# Patient Record
Sex: Male | Born: 1960 | Race: White | Hispanic: No | Marital: Married | Smoking: Former smoker
Health system: Southern US, Community
[De-identification: ages and names within clinical notes are randomized; demographics above are authoritative.]

## PROBLEM LIST (undated history)

## (undated) DIAGNOSIS — Z87442 Personal history of urinary calculi: Secondary | ICD-10-CM

## (undated) DIAGNOSIS — M1990 Unspecified osteoarthritis, unspecified site: Secondary | ICD-10-CM

## (undated) HISTORY — PX: NO PAST SURGERIES: SHX2092

---

## 2012-06-02 ENCOUNTER — Ambulatory Visit: Payer: Self-pay | Admitting: Internal Medicine

## 2012-09-02 ENCOUNTER — Inpatient Hospital Stay: Payer: Self-pay | Admitting: Urology

## 2012-09-02 ENCOUNTER — Emergency Department: Payer: Self-pay | Admitting: Emergency Medicine

## 2012-09-02 LAB — URINALYSIS, COMPLETE
Bacteria: NONE SEEN
Glucose,UR: NEGATIVE mg/dL (ref 0–75)
Ketone: NEGATIVE
Leukocyte Esterase: NEGATIVE
Ph: 5 (ref 4.5–8.0)
Specific Gravity: 1.014 (ref 1.003–1.030)
WBC UR: 1 /HPF (ref 0–5)

## 2012-09-02 LAB — BASIC METABOLIC PANEL
Anion Gap: 9 (ref 7–16)
BUN: 14 mg/dL (ref 7–18)
Calcium, Total: 8.2 mg/dL — ABNORMAL LOW (ref 8.5–10.1)
Chloride: 103 mmol/L (ref 98–107)
Creatinine: 0.83 mg/dL (ref 0.60–1.30)
EGFR (African American): >60
EGFR (Non-African Amer.): >60
Glucose: 107 mg/dL — ABNORMAL HIGH (ref 65–99)
Sodium: 135 mmol/L — ABNORMAL LOW (ref 136–145)

## 2012-09-02 LAB — CBC
HCT: 40.6 % (ref 40.0–52.0)
MCH: 32.5 pg (ref 26.0–34.0)
Platelet: 230 10*3/uL (ref 150–440)
RBC: 4.28 10*6/uL — ABNORMAL LOW (ref 4.40–5.90)
RDW: 13.4 % (ref 11.5–14.5)
WBC: 7.4 10*3/uL (ref 3.8–10.6)

## 2012-09-06 ENCOUNTER — Ambulatory Visit: Payer: Self-pay | Admitting: Urology

## 2012-09-07 ENCOUNTER — Emergency Department: Payer: Self-pay | Admitting: Emergency Medicine

## 2012-09-07 LAB — CBC
HGB: 14.9 g/dL (ref 13.0–18.0)
MCH: 32.6 pg (ref 26.0–34.0)
MCV: 95 fL (ref 80–100)
RBC: 4.57 10*6/uL (ref 4.40–5.90)
RDW: 13.4 % (ref 11.5–14.5)

## 2012-09-07 LAB — URINALYSIS, COMPLETE
Bilirubin,UR: NEGATIVE
Ketone: NEGATIVE
Leukocyte Esterase: NEGATIVE
Ph: 5 (ref 4.5–8.0)
RBC,UR: 45 /HPF (ref 0–5)
Specific Gravity: 1.026 (ref 1.003–1.030)
Squamous Epithelial: <1
WBC UR: 4 /HPF (ref 0–5)

## 2012-09-08 LAB — BASIC METABOLIC PANEL
Anion Gap: 7 (ref 7–16)
Calcium, Total: 9.3 mg/dL (ref 8.5–10.1)
Co2: 24 mmol/L (ref 21–32)
EGFR (Non-African Amer.): 44 — ABNORMAL LOW
Glucose: 110 mg/dL — ABNORMAL HIGH (ref 65–99)
Osmolality: 283 (ref 275–301)
Potassium: 4.5 mmol/L (ref 3.5–5.1)

## 2013-07-18 ENCOUNTER — Other Ambulatory Visit: Payer: Self-pay | Admitting: Rheumatology

## 2013-07-18 LAB — SYNOVIAL CELL COUNT + DIFF, W/ CRYSTALS
Basophil: 0 %
Crystals, Joint Fluid: NONE SEEN
EOS PCT: 0 %
Lymphocytes: 81 %
Neutrophils: 9 %
Nucleated Cell Count: 27 /mm3
Other Cells BF: 0 %
Other Mononuclear Cells: 11 %

## 2014-07-14 NOTE — Op Note (Signed)
PATIENT NAME:  Dwayne Salazar, Dwayne Salazar MR#:  [id number] DATE OF BIRTH:  1960-08-09  DATE OF PROCEDURE:  09/03/2012  PREOPERATIVE DIAGNOSES: 1.  Right proximal ureteral calculus.  2.  Right renal colic secondary to above.  POSTOPERATIVE DIAGNOSES: 1.  Right proximal ureteral calculus.  2.  Right renal colic secondary to above.   PROCEDURES: 1.  Right ureteroscopy with holmium laser lithotripsy/stone extraction.  2.  Placement right ureteral stent.   SURGEON:  Irineo AxonScott Kamarii Carton, M.D.   ASSISTANT:  None.   ANESTHESIA:  General.   INDICATION:  A 54 year old male admitted June 12 with refractory right renal colic secondary to a 7 mm right proximal ureteral stone.  After discussing treatment options, he has elected to proceed with ureteroscopy.   DESCRIPTION OF PROCEDURE:  He was taken to the cystoscopy suite and placed in supine position.  A general anesthetic was administered via an LMA.  He was then placed in the low lithotomy position and his external genitalia were prepped and draped in the usual sterile fashion.  Timeout was performed per protocol, with all in agreement.  A 22 French Olympus cystoscope was lubricated and passed under direct vision.  The urethra was normal in caliber without stricture.  Prostate was remarkable for moderate lateral lobe enlargement and moderate elevation of the bladder neck.  The bladder mucosa was closely inspected and panendoscopy shows no erythema, solid or papillary lesions.  The left ureteral orifice was normal-appearing with clear efflux.  No efflux was seen from the right ureteral orifice although it was normal in appearance.  A 0.035 Glidewire was placed through the cystoscope and into the right ureteral orifice.  Wire was easily passed up into the right renal pelvis under fluoroscopic guidance.  The cystoscope was removed and a 6 French semirigid ureteroscope was lubricated and passed under direct vision.  The right ureteral orifice was engaged without dilation.  The  scope was advanced proximally and the stone was identified in the lower part of the right proximal ureter.  A 365 micron holmium laser fiber was placed through the ureteroscope.  The stone was fragmented at a power of 5 watts.  Fragments were removed with an Escape basket and dropped into the bladder.  The last large fragment was grasped and would not advance beyond the distal ureter.  A 100 micron holmium laser fiber was placed through the ureteroscope.  The stone was fragmented in a basket and removed.  Basket was removed intact.  The ureteroscope was advanced up to the UPJ and no fragments were identified other than fine powdery residual ones.  The fragments dropped in the bladder were irrigated with re-passage of the cystoscope.  A 6 French/26 cm Microvasive Contour ureteral stent was placed over the guidewire.  Prior to ureteroscope removal and stent placement retrograde pyelogram was performed which showed no extravasation.  The stent showed a good curl in the renal pelvis on fluoroscopy and the distal end was well-positioned in the bladder.  The stent was left attached to a pull string and will be removed at a later date.  He was taken to the PACU in stable condition.  There were no complications.  EBL was minimal.  A B and O suppository was placed per rectum postop.     ____________________________ Verna CzechScott C. Lonna CobbStoioff, MD scs:ea D: 09/03/2012 19:21:28 ET T: 09/04/2012 06:12:30 ET JOB#: [id number]  cc: Lorin PicketScott C. Lonna CobbStoioff, MD, <Dictator> Riki AltesSCOTT C Levie Wages MD ELECTRONICALLY SIGNED 09/15/2012 13:18

## 2014-07-14 NOTE — H&P (Signed)
PATIENT NAME:  Dwayne Salazar, Dwayne Salazar MR#:  [id number] DATE OF BIRTH:  1960-07-01  DATE OF ADMISSION:  09/02/2012  ADMISSION DIAGNOSES:  1.  Right proximal ureteral calculus.  2.  Right renal colic.   HISTORY OF PRESENT ILLNESS: A 54 year old male, who late on the evening of 6/11 had acute onset of right flank pain radiating to the right lower quadrant. This pain was associated with nausea. He denied fever, chills, frequency, urgency or dysuria. He has noted intermittent hematuria. Pain was rated as severe and intermittently sharp and stabbing. There were no identifiable precipitating, aggravating or alleviating factors. A stone protocol CT was performed, which was remarkable for a 7 mm right proximal ureteral calculus with moderate hydronephrosis and hydroureter. He received parenteral analgesics and was discharged on oral analgesics; however, continued to have intermittent pain, which was not controlled with his pain medication. He returned to the Emergency Department the afternoon of 06/12 complaining of persistent pain. His pain was not able to be adequately controlled for outpatient management and admission was requested. There is no previous history of stone disease or urologic problems.   PAST MEDICAL HISTORY: He is healthy without chronic medical illness.   PAST SURGICAL HISTORY: None.   MEDICATIONS ON ADMISSION: Zofran ODT 4 mg t.i.d. p.r.n., Percocet 5/325, 1 p.o. q. 6 hours p.r.n. pain and tamsulosin 0.4 mg daily.   ALLERGIES: NKDA.   SOCIAL HISTORY: No tobacco. Occasional alcohol use.   REVIEW OF SYSTEMS: GENERAL: Denies fever or chills. HEENT: No headache or vision change. PULMONARY: Denies cough or shortness of breath. CARDIOVASCULAR: Denies chest pain or palpitations. GASTROINTESTINAL: Positive nausea  and vomiting. GENITOURINARY: As per the HPI. ENDOCRINE: No excessive thirst. NEUROLOGIC: Denies history of CVA or seizure. DERMATOLOGIC: Negative rash. MUSCULOSKELETAL: Negative bone or joint  pain. HEMATOLOGY: No history of bleeding or clotting disorders.   PHYSICAL EXAMINATION:  VITAL SIGNS: Temperature 98.8, pulse 66, blood pressure 130/72. GENERAL: The patient is alert, cooperative in no acute distress.  HEENT: Oral cavity is clear.  NECK: Supple without adenopathy.  LUNGS: Clear to auscultation.  CARDIOVASCULAR: Regular rate and rhythm without murmur.  ABDOMEN: Soft, nontender without masses.  BACK: No CVA tenderness.  NEUROLOGIC: No focal deficits.   DATA: Stone protocol CT was reviewed and the 7 mm right proximal stone is easily visualized. There is moderate hydronephrosis and hydroureter. Creatinine 0.83. Urinalysis 60 RBCs and 1 WBC.   IMPRESSION: Right proximal ureteral calculus/right renal colic.   PLAN: The patient is admitted for parenteral analgesia. Based on stone size and location, it is unlikely it will pass spontaneously and management options will be discussed.    ____________________________ Verna CzechScott C. Lonna CobbStoioff, MD scs:aw D: 09/03/2012 08:02:10 ET T: 09/03/2012 08:10:11 ET JOB#: [id number]  cc: Dwayne PicketScott C. Lonna CobbStoioff, MD, <Dictator> Dwayne AltesSCOTT C STOIOFF MD ELECTRONICALLY SIGNED 09/15/2012 13:15

## 2016-08-04 ENCOUNTER — Other Ambulatory Visit: Payer: Self-pay | Admitting: Internal Medicine

## 2016-08-04 DIAGNOSIS — R413 Other amnesia: Secondary | ICD-10-CM

## 2018-08-19 ENCOUNTER — Other Ambulatory Visit: Payer: Self-pay | Admitting: General Surgery

## 2018-08-19 DIAGNOSIS — R1031 Right lower quadrant pain: Secondary | ICD-10-CM

## 2018-08-19 DIAGNOSIS — R1032 Left lower quadrant pain: Secondary | ICD-10-CM

## 2018-08-25 ENCOUNTER — Ambulatory Visit: Payer: BLUE CROSS/BLUE SHIELD

## 2018-09-03 ENCOUNTER — Ambulatory Visit
Admission: RE | Admit: 2018-09-03 | Discharge: 2018-09-03 | Disposition: A | Discharge status: Home / Self Care | Payer: BLUE CROSS/BLUE SHIELD | Source: Ambulatory Visit | Attending: General Surgery | Admitting: General Surgery

## 2018-09-03 DIAGNOSIS — R1031 Right lower quadrant pain: Secondary | ICD-10-CM

## 2018-09-06 ENCOUNTER — Ambulatory Visit: Payer: Self-pay | Admitting: General Surgery

## 2018-09-06 NOTE — H&P (Signed)
PATIENT PROFILE: Dwayne Salazar is a 58 y.o. male who presents to the Clinic for consultation at the request of Dr. Doy Hutching for evaluation of inguinal hernia.  PCP:  Idelle Crouch, MD  HISTORY OF PRESENT ILLNESS: Mr. Laura reports having discomfort on the left groin since a month ago.  He reports that he started feeling discomfort when he rolled in his bed while sleeping.  He does sometimes has discomfort when he bent over.  Pain localized in the left groin area.  Pain does not radiate to other part of the body.  Aggravating factors are bending involved in bed.  No alleviating factor identified.  Patient reports he also has an umbilical hernia since many years ago.  There is minimal discomfort in that area.  He does report that he feels that it is getting bigger with time.  He denies any episode of abdominal distention nausea or vomiting.  He denies pain on the right groin but sometimes fails apology on the area.   PROBLEM LIST:         Problem List  Date Reviewed: 08/09/2018         Noted   Former tobacco use 08/03/2017   Arthritis 07/31/2016   Borderline hypertension Unknown   Hyperuricemia Unknown   History of kidney stones Unknown      GENERAL REVIEW OF SYSTEMS:   General ROS: negative for - chills, fatigue, fever, weight gain or weight loss Allergy and Immunology ROS: negative for - hives  Hematological and Lymphatic ROS: negative for - bleeding problems or bruising, negative for palpable nodes Endocrine ROS: negative for - heat or cold intolerance, hair changes Respiratory ROS: negative for - cough, shortness of breath or wheezing Cardiovascular ROS: no chest pain.  Positive for palpitations GI ROS: negative for nausea, vomiting, abdominal pain, diarrhea, constipation.  Positive for blood in stool Musculoskeletal ROS: Positive for - joint swelling or muscle pain Neurological ROS: negative for - confusion, syncope Dermatological ROS: negative for pruritus.positive  for itching and rash Psychiatric: negative for anxiety, depression, difficulty sleeping and memory loss  MEDICATIONS: CurrentMedications        Current Outpatient Medications  Medication Sig Dispense Refill  . aspirin 81 MG EC tablet Take 81 mg by mouth once daily.     No current facility-administered medications for this visit.       ALLERGIES: Amoxicillin (bulk)  PAST MEDICAL HISTORY:     Past Medical History:  Diagnosis Date  . Arthritis   . Borderline hypertension   . History of hematuria   . History of kidney stones   . Hyperuricemia     PAST SURGICAL HISTORY:      Past Surgical History:  Procedure Laterality Date  . COLONOSCOPY  08/19/2012   Adenomatous Polyp, FH Colon Polyps (Father): [facility] 07/2017; Recall Ltr mailed 06/16/2017 (dh)  . COLONOSCOPY  08/25/2017   PH Adenomatous Polyps; FHCP (Father) [facility] 08/2022     FAMILY HISTORY:      Family History  Problem Relation Age of Onset  . No Known Problems Sister   . Colon polyps Father   . Prostate cancer Father   . Coronary Artery Disease (Blocked arteries around heart) Father   . Osteoarthritis Father   . Skin cancer Father   . Coronary Artery Disease (Blocked arteries around heart) Mother   . Deep vein thrombosis (DVT or abnormal blood clot formation) Mother   . Osteoarthritis Mother   . Stroke Mother   . Obesity Brother  SOCIAL HISTORY: Social History          Socioeconomic History  . Marital status: Married    Spouse name: Not on file  . Number of children: Not on file  . Years of education: Not on file  . Highest education level: Not on file  Occupational History  . Not on file  Social Needs  . Financial resource strain: Not on file  . Food insecurity:    Worry: Not on file    Inability: Not on file  . Transportation needs:    Medical: Not on file    Non-medical: Not on file  Tobacco Use  . Smoking status: Former Smoker    Packs/day: 1.00     Years: 30.00    Pack years: 30.00    Last attempt to quit: 03/24/2012    Years since quitting: 6.4  . Smokeless tobacco: Never Used  Substance and Sexual Activity  . Alcohol use: Yes    Types: 6 Standard drinks or equivalent per week    Comment: Occasional  . Drug use: No  . Sexual activity: Yes    Partners: Female    Birth control/protection: Surgical  Other Topics Concern  . Not on file  Social History Narrative  . Not on file      PHYSICAL EXAM:    Vitals:   08/19/18 1025  BP: (!) 137/91  Pulse: 81   Body mass index is 30.92 kg/m. Weight: (!) 103.4 kg (228 lb)   GENERAL: Alert, active, oriented x3  HEENT: Pupils equal reactive to light. Extraocular movements are intact. Sclera clear. Palpebral conjunctiva normal red color.Pharynx clear.  NECK: Supple with no palpable mass and no adenopathy.  LUNGS: Sound clear with no rales rhonchi or wheezes.  HEART: Regular rhythm S1 and S2 without murmur.  ABDOMEN: Soft and depressible, nontender with no palpable mass, no hepatomegaly.  Soft moderate sized umbilical hernia, reducible.  There is a bulging on the left side of the groin, nontender, soft but unable to identify the external inguinal ring.  No inguinal hernia identified on the right side.  EXTREMITIES: Well-developed well-nourished symmetrical with no dependent edema.  NEUROLOGICAL: Awake alert oriented, facial expression symmetrical, moving all extremities.  REVIEW OF DATA: I have reviewed the following data today:      Office Visit on 08/09/2018  Component Date Value  . Glucose 08/09/2018 93   . Sodium 08/09/2018 139   . Potassium 08/09/2018 4.3   . Chloride 08/09/2018 106   . Carbon Dioxide (CO2) 08/09/2018 28.0   . Urea Nitrogen (BUN) 08/09/2018 21   . Creatinine 08/09/2018 0.9   . Glomerular Filtration Ra* 08/09/2018 87   . Calcium 08/09/2018 8.9   . AST  08/09/2018 17   . ALT  08/09/2018 17   . Alk Phos (alkaline  Phosp* 08/09/2018 71   . Albumin 08/09/2018 4.3   . Bilirubin, Total 08/09/2018 0.5   . Protein, Total 08/09/2018 6.2   . A/G Ratio 08/09/2018 2.3   . WBC (White Blood Cell Co* 08/09/2018 4.6   . RBC (Red Blood Cell Coun* 08/09/2018 4.41*  . Hemoglobin 08/09/2018 14.4   . Hematocrit 08/09/2018 41.9   . MCV (Mean Corpuscular Vo* 08/09/2018 95.0   . MCH (Mean Corpuscular He* 08/09/2018 32.7*  . MCHC (Mean Corpuscular H* 08/09/2018 34.4   . Platelet Count 08/09/2018 174   . RDW-CV (Red Cell Distrib* 08/09/2018 12.1   . MPV (Mean Platelet Volum* 08/09/2018 9.7   . Neutrophils  08/09/2018 2.27   . Lymphocytes 08/09/2018 1.30   . Monocytes 08/09/2018 0.54   . Eosinophils 08/09/2018 0.47   . Basophils 08/09/2018 0.05   . Neutrophil % 08/09/2018 49.0   . Lymphocyte % 08/09/2018 28.0   . Monocyte % 08/09/2018 11.6   . Eosinophil % 08/09/2018 10.1*  . Basophil% 08/09/2018 1.1   . Immature Granulocyte % 08/09/2018 0.2   . Immature Granulocyte Cou* 08/09/2018 0.01   . Cholesterol, Total 08/09/2018 193   . Triglyceride 08/09/2018 60   . HDL (High Density Lipopr* 08/09/2018 75.5*  . LDL Calculated 08/09/2018 106   . VLDL Cholesterol 08/09/2018 12   . Cholesterol/HDL Ratio 08/09/2018 2.6   . Color 08/09/2018 Yellow   . Clarity 08/09/2018 Clear   . Specific Gravity 08/09/2018 1.025   . pH, Urine 08/09/2018 5.5   . Protein, Urinalysis 08/09/2018 Negative   . Glucose, Urinalysis 08/09/2018 Negative   . Ketones, Urinalysis 08/09/2018 Negative   . Blood, Urinalysis 08/09/2018 Trace*  . Nitrite, Urinalysis 08/09/2018 Negative   . Leukocyte Esterase, Urin* 08/09/2018 Negative   . White Blood Cells, Urina* 08/09/2018 0-3   . Red Blood Cells, Urinaly* 08/09/2018 0-3   . Bacteria, Urinalysis 08/09/2018 None Seen   . Squamous Epithelial Cell* 08/09/2018 Rare   . PSA (Prostate Specific A* 08/09/2018 1.78   . Vitamin D, 25-Hydroxy - * 08/09/2018 34.0      ASSESSMENT: Mr. Mavis is a 58 y.o.  male presenting for consultation for left inguinal hernia.    The patient presents with a symptomatic, inguinal pain. Patient was oriented about the possible diagnosis of inguinal hernia and its implication. The patient was oriented about the treatment alternatives (observation vs surgical repair). Due to patient symptoms, repair is recommended. Patient oriented about the surgical procedure, the use of mesh and its risk of complications such as: infection, bleeding, injury to vas deference, vasculature and testicle, injury to bowel or bladder, and chronic pain.  Patient had CT scan done showing the bilateral inguinal hernia and the umbilical hernia. Patient oriented about the CT results and agreed to proceed with surgical repair of both inguinal hernias and the umbilical hernia.    PLAN: 1.  Laparoscopic bilateral inguinal hernia repair with mesh (27670 x 2) 2. Umbilical hernia repair with mesh (11003)  Patient verbalized understanding, all questions were answered, and were agreeable with the plan outlined above.    Herbert Pun, MD

## 2018-09-15 ENCOUNTER — Encounter
Admission: RE | Admit: 2018-09-15 | Discharge: 2018-09-15 | Disposition: A | Discharge status: Home / Self Care | Payer: BC Managed Care – PPO | Source: Ambulatory Visit | Attending: General Surgery | Admitting: General Surgery

## 2018-09-15 HISTORY — DX: Unspecified osteoarthritis, unspecified site: M19.90

## 2018-09-15 HISTORY — DX: Personal history of urinary calculi: Z87.442

## 2018-09-16 ENCOUNTER — Other Ambulatory Visit: Admission: RE | Admit: 2018-09-16 | Payer: BC Managed Care – PPO | Source: Ambulatory Visit

## 2018-09-20 ENCOUNTER — Ambulatory Visit: Admit: 2018-09-20 | Payer: BC Managed Care – PPO | Admitting: General Surgery

## 2018-09-20 SURGERY — REPAIR, HERNIA, INGUINAL, ADULT
Anesthesia: General | Laterality: Bilateral

## 2021-03-23 IMAGING — CT CT ABDOMEN AND PELVIS WITHOUT CONTRAST
2 of 4 series · 16 of 46 positions shown, 18 images · non-contrast
Comparison: None.

CLINICAL DATA: Umbilical hernia. Inguinal hernia. Left groin pain
and fullness x3 months worsening over the last month.

EXAM:
CT ABDOMEN AND PELVIS WITHOUT CONTRAST
TECHNIQUE: Multidetector CT imaging of the abdomen and pelvis was performed
following the standard protocol without IV contrast.

[Series 2: routine abdomen pelvis without 5.00 br40 s3 ax · axial · non-contrast · 0.83mm/px · z∈[+1013,+1463]mm · 13 of 100 slices shown, 15 images]
[im 5/100  soft-tissue]
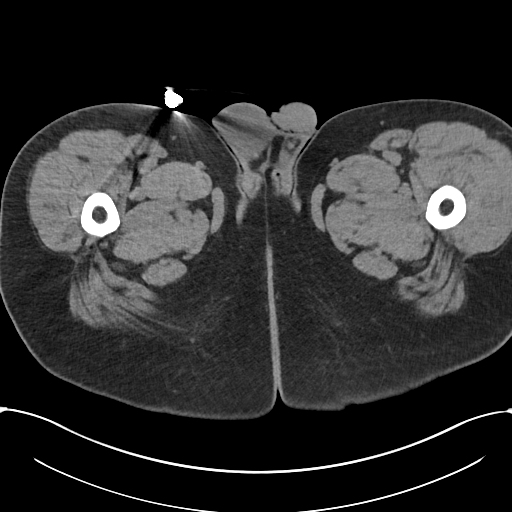
[im 5/100  bone]
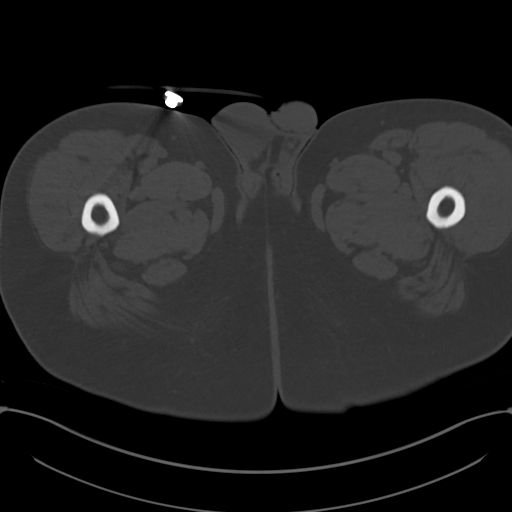
[im 13/100  soft-tissue]
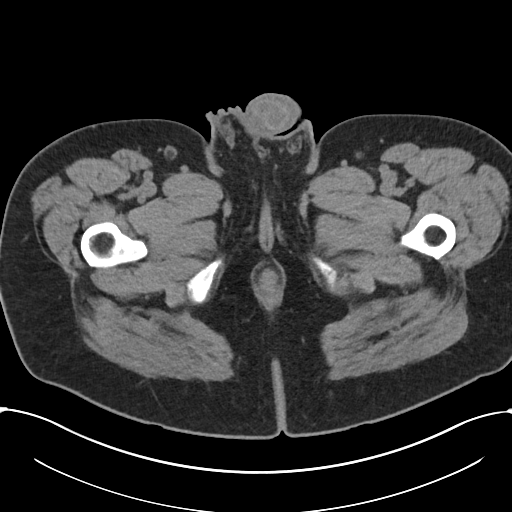
[im 21/100  soft-tissue]
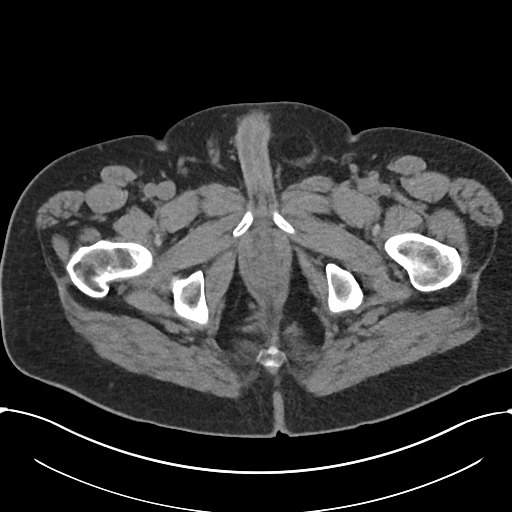
[im 29/100  soft-tissue]
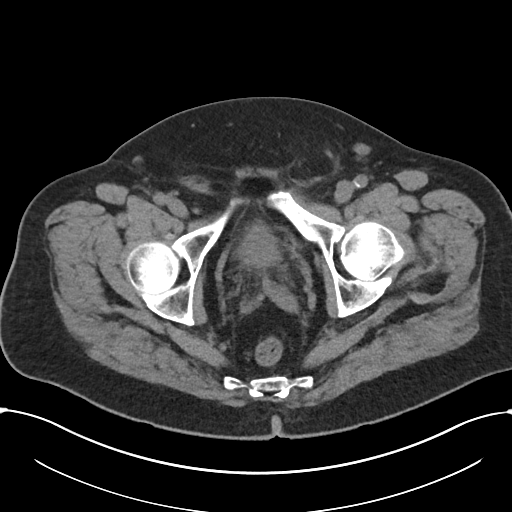
[im 34/100  soft-tissue]
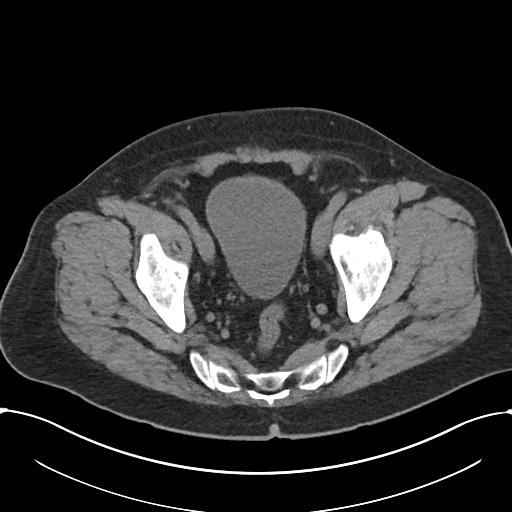
[im 42/100  soft-tissue]
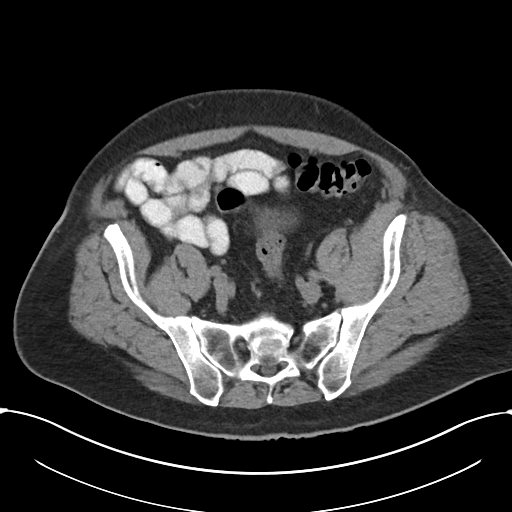
[im 50/100  soft-tissue]
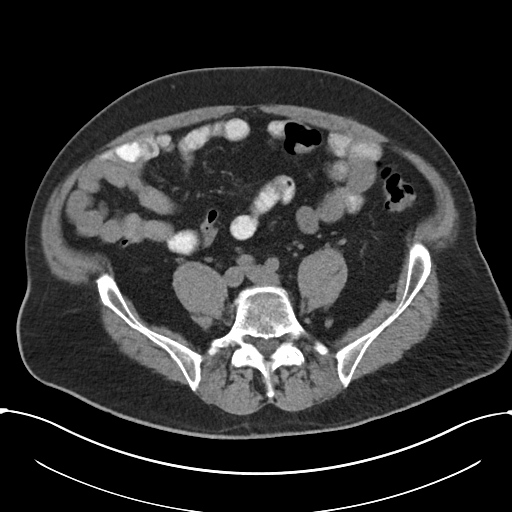
[im 58/100  soft-tissue]
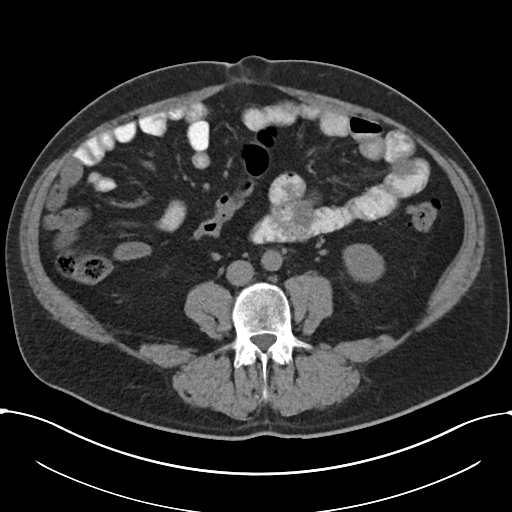
[im 67/100  soft-tissue]
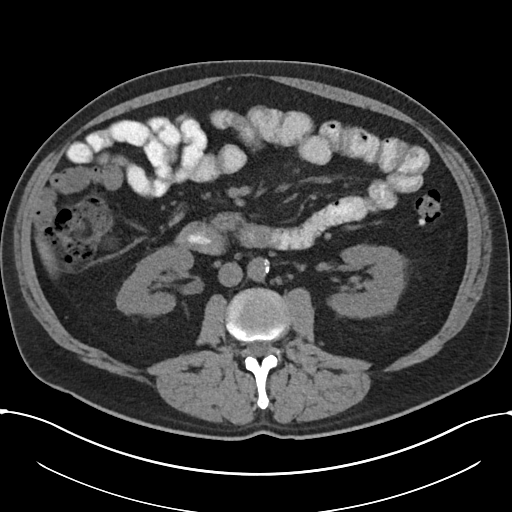
[im 67/100  bone]
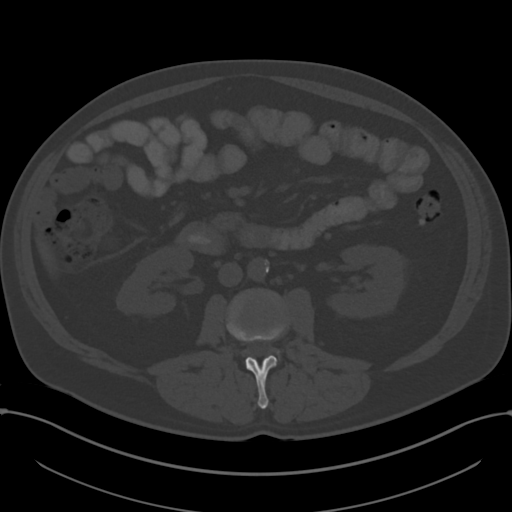
[im 71/100  soft-tissue]
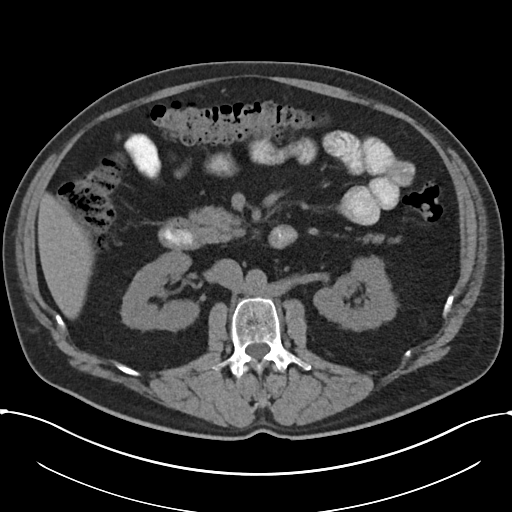
[im 79/100  soft-tissue]
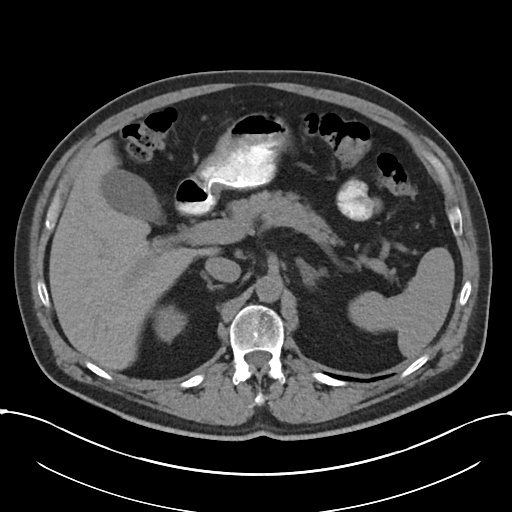
[im 87/100  soft-tissue]
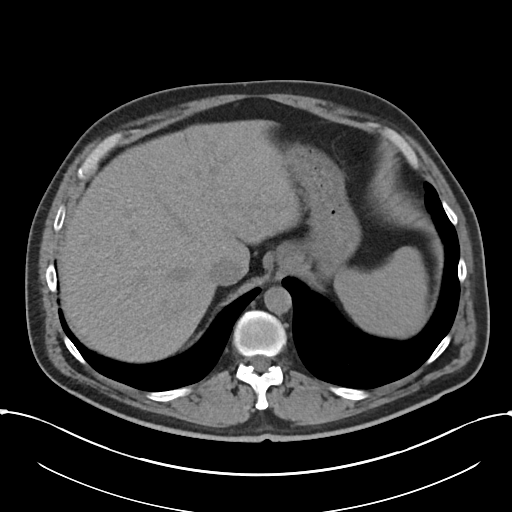
[im 95/100  soft-tissue]
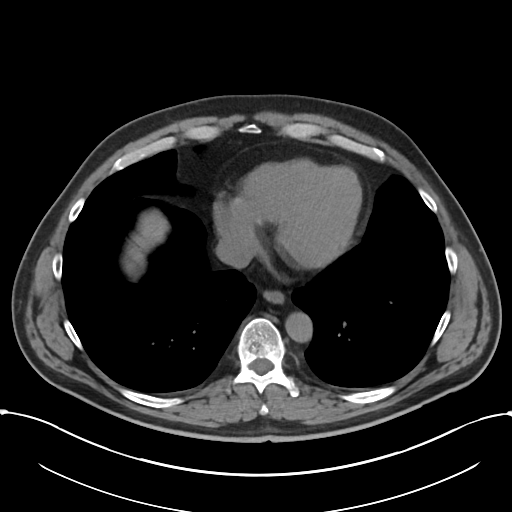

[Series 4: routine abdomen pelvis without 2.00 br40 s3 cor · coronal · non-contrast · 0.83mm/px · 3 of 177 slices shown]
[im 59/177  soft-tissue]
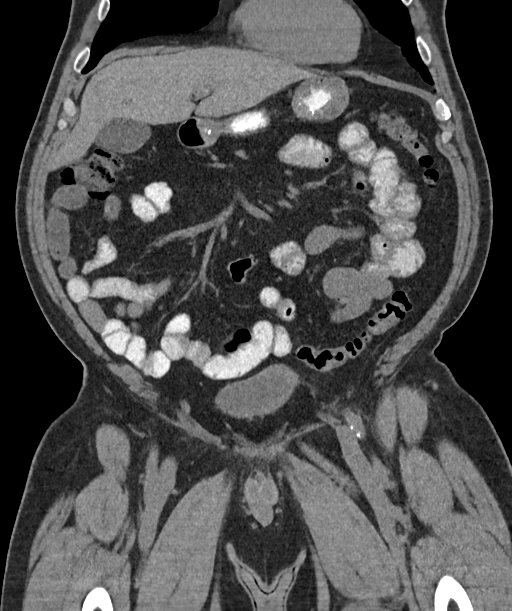
[im 79/177  soft-tissue]
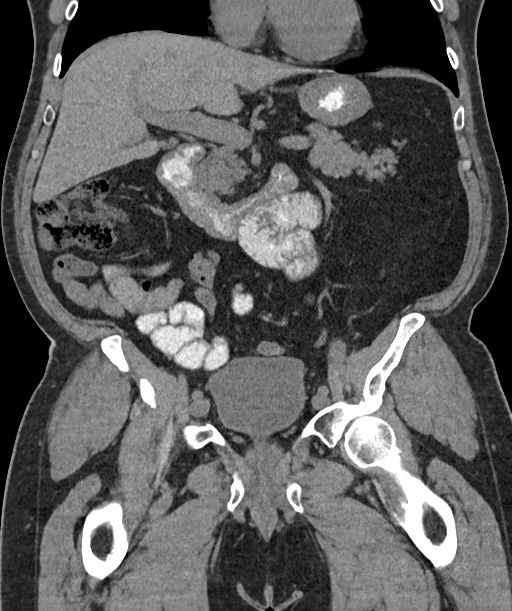
[im 98/177  soft-tissue]
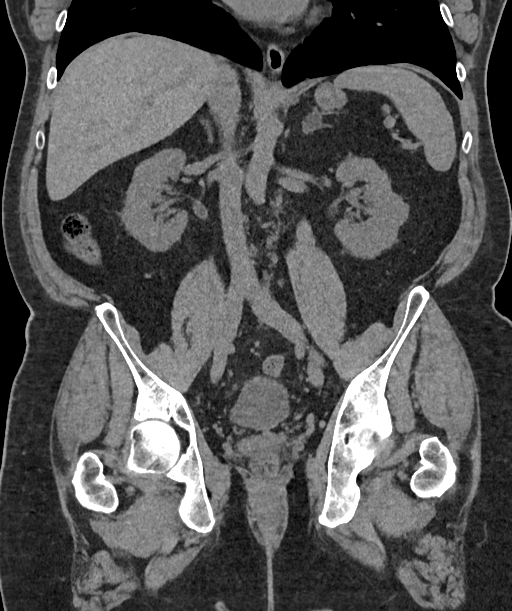

[16 of 46 positions shown; findings below may reference images not displayed]

FINDINGS: Lower chest: No acute abnormality.

Hepatobiliary: No focal liver abnormality is seen. No gallstones,
gallbladder wall thickening, or biliary dilatation.

Pancreas: Unremarkable. No pancreatic ductal dilatation or
surrounding inflammatory changes.

Spleen: Normal in size without focal abnormality.

Adrenals/Urinary Tract: There is a benign lipid rich adrenal adenoma
on the left measuring 1.5 cm. The right adrenal gland is
unremarkable. Right kidney is unremarkable without evidence of
hydronephrosis or a radiopaque kidney stone. There are a few
punctate nonobstructing stones in the left kidney. No left-sided
hydronephrosis. No left-sided ureteral stones player in the bladder
is unremarkable.

Stomach/Bowel: Stomach is within normal limits. Appendix appears
normal. No evidence of bowel wall thickening, distention, or
inflammatory changes.

Vascular/Lymphatic: Aortic atherosclerosis. No enlarged abdominal or
pelvic lymph nodes. There is a retroaortic left renal vein, a normal
variant.

Reproductive: Prostate is unremarkable.

Other: There is a small fat containing periumbilical hernia. There
is a moderate-sized fat containing left inguinal hernia. There is a
small fat containing right inguinal hernia.

Musculoskeletal: There are multiple benign vertebral body
hemangiomas. There is no displaced fracture.
IMPRESSION: 1. No acute intra-abdominal abnormality detected.
2. Moderate-sized fat containing left inguinal hernia. Small fat
containing right inguinal hernia.
3. Small fat containing periumbilical hernia.
4. Left-sided nephrolithiasis.  No hydronephrosis.
5. Normal appendix in the right lower quadrant.
